# Patient Record
Sex: Female | Born: 1965 | Race: White | Hispanic: No | Marital: Married
Health system: Southern US, Community
[De-identification: ages and names within clinical notes are randomized; demographics above are authoritative.]

---

## 2002-03-01 ENCOUNTER — Other Ambulatory Visit: Admission: RE | Admit: 2002-03-01 | Discharge: 2002-03-01 | Payer: Self-pay | Admitting: Family Medicine

## 2004-02-14 ENCOUNTER — Ambulatory Visit: Payer: Self-pay | Admitting: Family Medicine

## 2004-02-20 ENCOUNTER — Ambulatory Visit: Payer: Self-pay | Admitting: Family Medicine

## 2004-02-28 ENCOUNTER — Ambulatory Visit: Payer: Self-pay | Admitting: Family Medicine

## 2004-03-20 ENCOUNTER — Encounter: Admission: RE | Admit: 2004-03-20 | Discharge: 2004-03-20 | Payer: Self-pay | Admitting: Specialist

## 2004-04-23 ENCOUNTER — Encounter: Admission: RE | Admit: 2004-04-23 | Discharge: 2004-04-23 | Payer: Self-pay | Admitting: Specialist

## 2004-05-17 ENCOUNTER — Observation Stay (HOSPITAL_COMMUNITY): Admission: RE | Admit: 2004-05-17 | Discharge: 2004-05-18 | Payer: Self-pay | Admitting: Specialist

## 2004-08-02 ENCOUNTER — Ambulatory Visit: Payer: Self-pay | Admitting: Family Medicine

## 2005-05-05 IMAGING — CT CT L SPINE W/ CM
3 of 10 series · 13 of 33 positions shown, 16 images · IV contrast (omnipaque)
Comparison: none

CLINICAL DATA: Low back and right joint pain.

LUMBAR MYELOGRAM:
CT LUMBAR SPINE WITH INTRATHECAL CONTRAST:
TECHNIQUE: An appropriate entry site was determined under fluoroscopy. Skin site
was marked, prepped with Betadine, and draped in usual sterile fashion, and
infiltrated locally with 1% lidocaine. A 22 gauge spinal needle was  advanced
into the thecal sac at L4-L5 from a midline approach. . Clear colorless CSF
returned. 17 ml Omnipaque 180 were administered intrathecally for lumbar
myelography, followed by axial CT scanning of the lumbar spine. Coronal and
sagittal reconstructions were generated from the axial scan.

[Series 4: recon 3: l spine · axial · 0.27mm/px · z∈[-205,-65]mm · 5 of 351 slices shown, 7 images]
[im 59/351  soft-tissue]
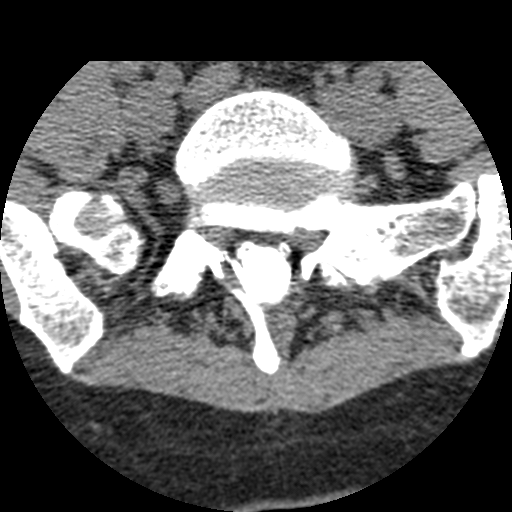
[im 59/351  bone]
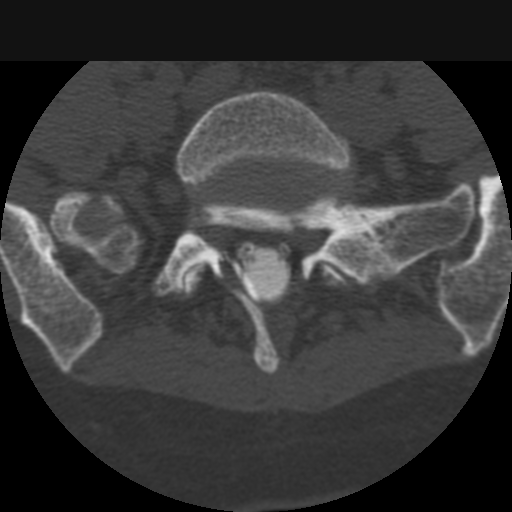
[im 117/351  bone]
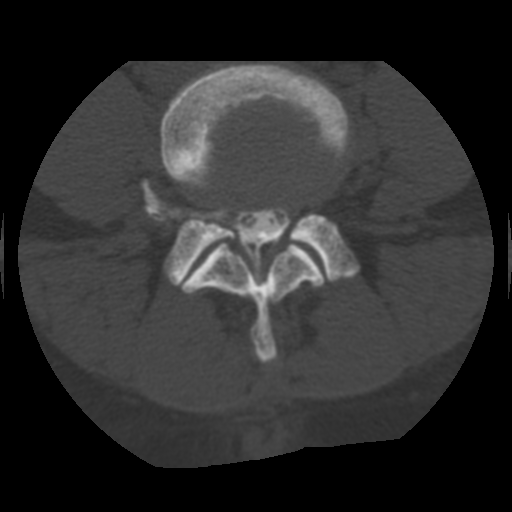
[im 176/351  bone]
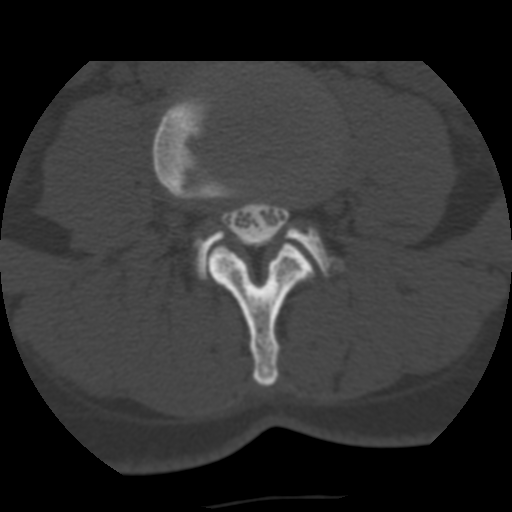
[im 234/351  bone]
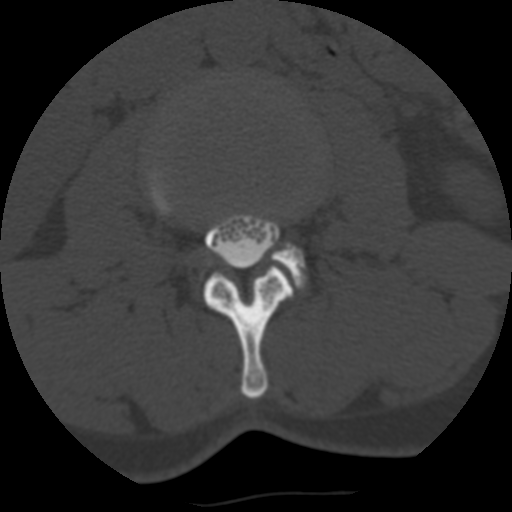
[im 292/351  soft-tissue]
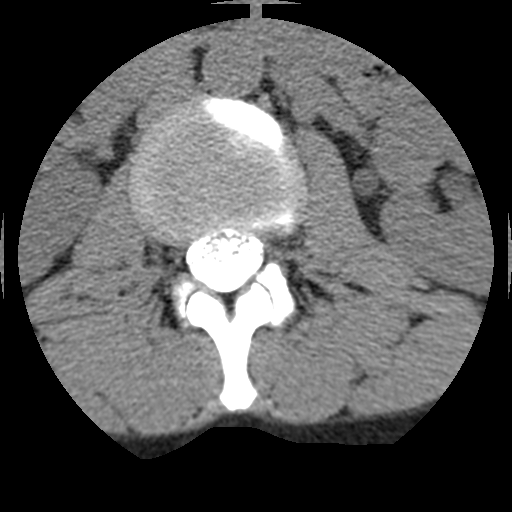
[im 292/351  bone]
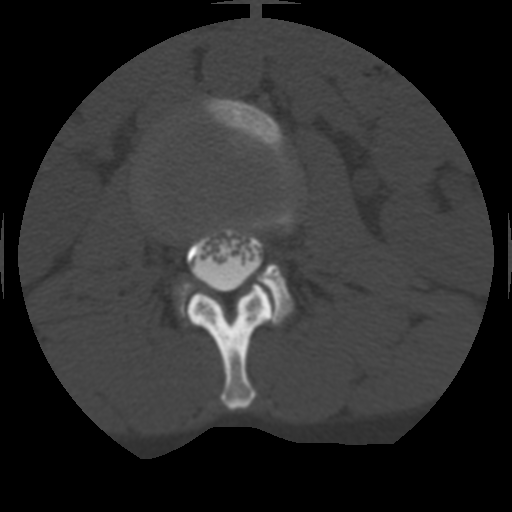

[Series 400: reformatted · sagittal · 0.42mm/px · 5 of 40 slices shown, 6 images (1 of 2)]
[im 14/40  bone]
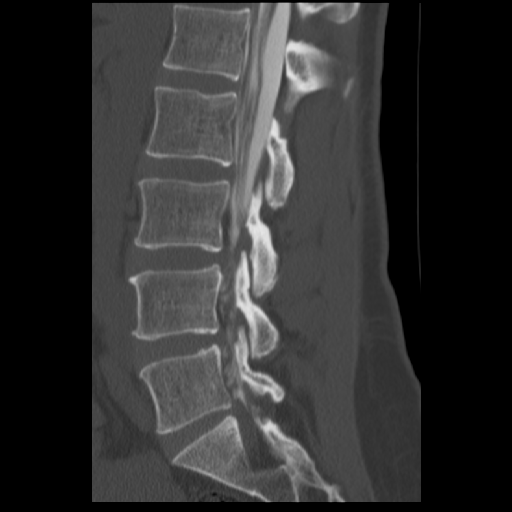
[im 17/40  bone]
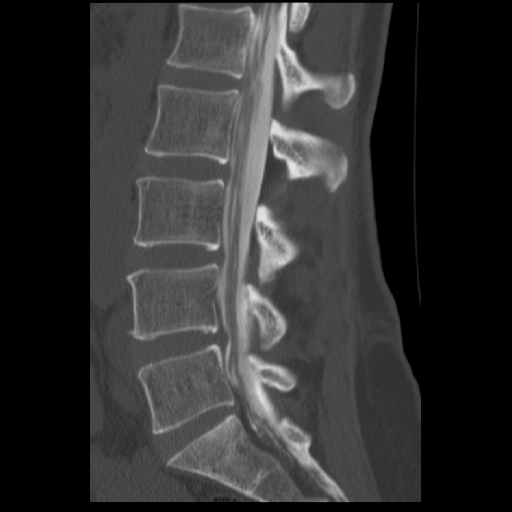
[im 20/40  soft-tissue]
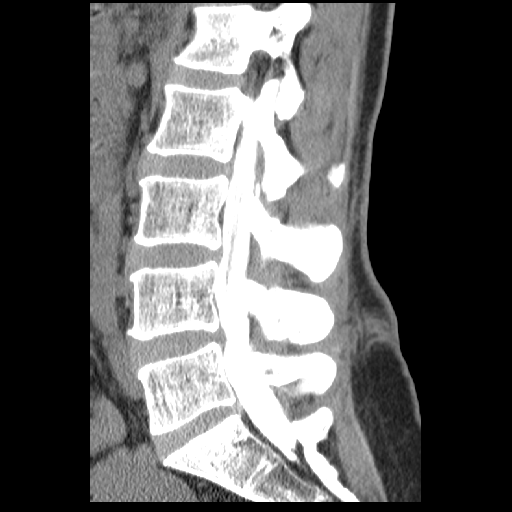
[im 20/40  bone]
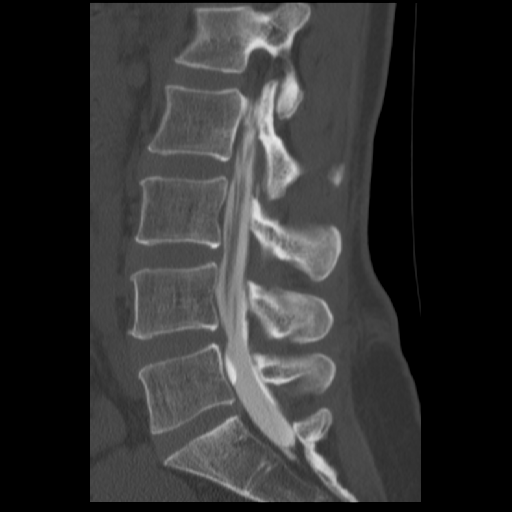
[im 23/40  bone]
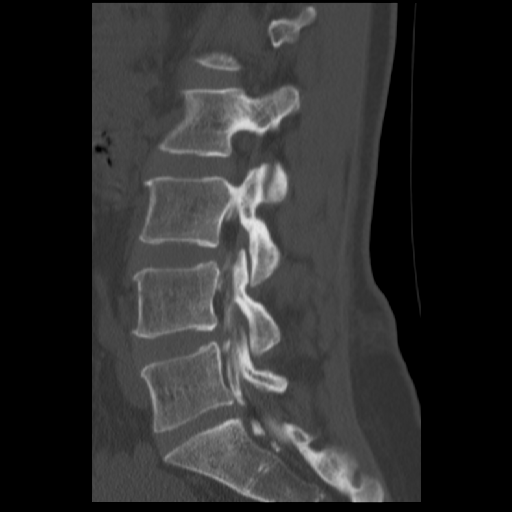
[im 27/40  bone]
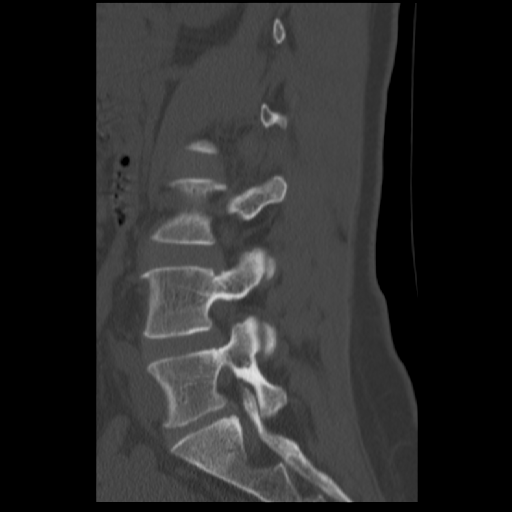

[Series 401: reformatted · coronal · 0.42mm/px · 3 of 40 slices shown (2 of 2)]
[im 8/40  bone]
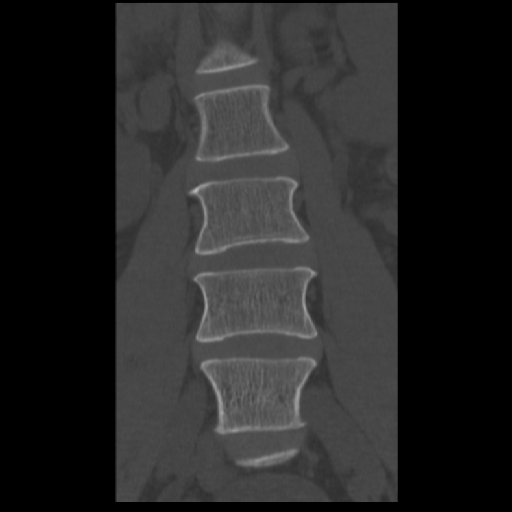
[im 16/40  bone]
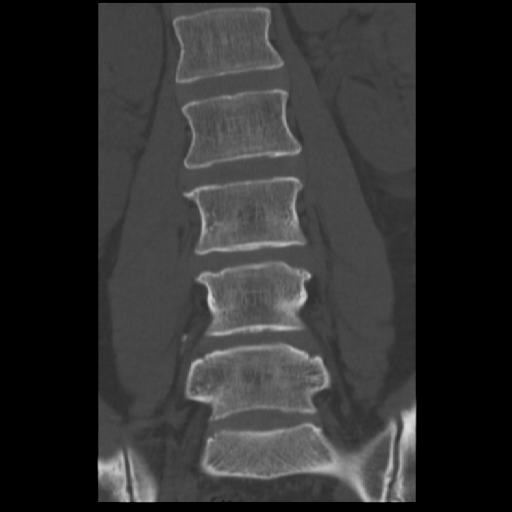
[im 24/40  bone]
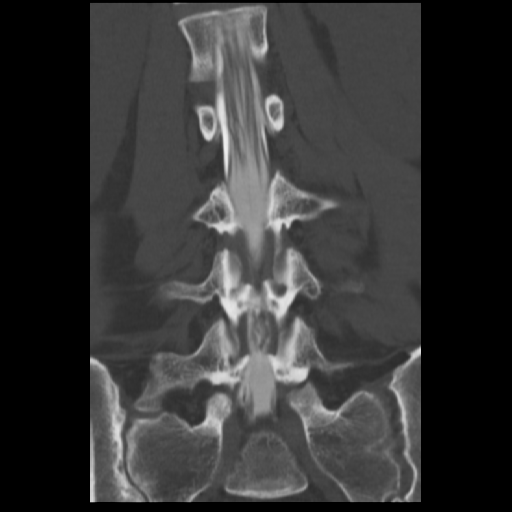

[13 of 33 positions shown; findings below may reference images not displayed]

FINDINGS: Hypoplastic ribs at T12 with a transitional L5 segment.

T12-L1: Unremarkable. Conus terminates behind L-1 vertebral body.

L1-L2: Unremarkable

L2-L3: Mild circumferential disc bulge. No spinal stenosis or significant neural
foraminal encroachment.

L3-L4: Mild circumferential disc bulge. Mild bilateral facet degenerative
hypertrophy contributing to mild multifactorial spinal stenosis. There is early
bilateral neural foraminal encroachment. No nerve root sleeve cut off
myelographically.

L4-L5: Broad posterior disc protrusion. Mild bilateral facet degenerative
hypertrophy contributing to mild multifactorial spinal stenosis. There is early
neural foraminal encroachment bilaterally. Some subarachnoid contrast material
is noted this level.

L5-S1: There is a shallow left posterolateral and foraminal disc protrusion
which may contact the left L5 nerve root in the foramen. There is no cut off
however. Neural foramina widely patent. Mild bilateral facet degenerative
change. The right L5 transverse process articulates with the sacrum.

Standing lateral flexion and extension radiographs as well as bilateral bending
films demonstrate no dynamic instability. There is early sclerosis around both
sacroiliac joints with small marginal spurs.
IMPRESSION: 1. Mild multifactorial spinal stenosis L3-L4 and L4-L5 with early neural
foraminal encroachment.
2. Shallow   left posterolateral and foraminal protrusion L5-S1.
3. Mild bilateral sacroiliitis

## 2006-05-27 ENCOUNTER — Encounter: Admission: RE | Admit: 2006-05-27 | Discharge: 2006-05-27 | Payer: Self-pay | Admitting: Specialist

## 2006-08-01 ENCOUNTER — Encounter: Admission: RE | Admit: 2006-08-01 | Discharge: 2006-08-01 | Payer: Self-pay | Admitting: Specialist

## 2006-09-11 ENCOUNTER — Encounter: Admission: RE | Admit: 2006-09-11 | Discharge: 2006-09-11 | Payer: Self-pay | Admitting: Specialist

## 2007-10-07 ENCOUNTER — Ambulatory Visit (HOSPITAL_BASED_OUTPATIENT_CLINIC_OR_DEPARTMENT_OTHER): Admission: RE | Admit: 2007-10-07 | Discharge: 2007-10-07 | Payer: Self-pay | Admitting: Orthopedic Surgery

## 2010-03-04 ENCOUNTER — Encounter: Payer: Self-pay | Admitting: Specialist

## 2010-06-26 NOTE — Op Note (Signed)
NAMENURY, NEBERGALL          ACCOUNT NO.:  [id number]   MEDICAL RECORD NO.:  [id number]          PATIENT TYPE:  AMB   LOCATION:  DSC                          FACILITY:  [facility]   PHYSICIAN:  Leonides Grills, M.D.     DATE OF BIRTH:  1965/09/30   DATE OF PROCEDURE:  10/07/2007  DATE OF DISCHARGE:                               OPERATIVE REPORT   PREOPERATIVE DIAGNOSIS:  Left anterior ankle impingement.   POSTOPERATIVE DIAGNOSIS:  Left anterior ankle impingement.   OPERATION:  Left ankle arthroscopy with extensive debridement.   ANESTHESIA:  General.   SURGEON:  Leonides Grills, MD   ASSISTANT:  Richardean Canal, PA-C.   ESTIMATED BLOOD LOSS:  Minimal.   TOURNIQUET TIME:  None.   COMPLICATIONS:  None.   DISPOSITION:  Stable to PR.   INDICATIONS:  This is a 45 year old female who was injured at work and  has had persistent left anterior ankle pain that was interfering with  her life.  She was consented for the above procedure.  All risks  including infection, vessel injury, persistent pain, worsening pain,  prolonged recovery, stiffness, arthritis, sinus formation, and synovial  cyst formation were all explained.  Questions were encouraged and  answered.   OPERATION:  The patient was brought to the operating room and placed in  supine position after adequate general anesthesia was administered as  well as Ancef 1 g IV piggyback.  A bump was placed in the left  ipsilateral hip, internally rotating the left lower extremity.  The left  lower extremity was prepped and draped in sterile manner.  No tourniquet  was used.  Anatomical landmarks including anterior tibialis tendon and  peroneus tertius tendon were then mapped out.  Superficial peroneal  nerve could not be seen.  Spinal needle was then placed just medial to  the anterior tibialis tendon in the ankle and 20 mL of normal saline was  instilled in the ankle.  Nick-and-spread technique was then utilized to  create the  anteromedial portal.  Blunt tip trocar with cannula followed  by camera was then placed in the ankle.  Under direct visualization, the  anterolateral portal was created lateral to the peroneus tertius tendon.  Using a nick-and-spread technique from illuminating from inside out,  there was no evidence of superficial peroneal nerve in this interval.  There was a concentration of a large amount of synovitis on the  anterolateral aspect of the ankle over and around the accessory tib-fib  ligament.  The anterolateral corner of the talar dome was impinging this  area as well when you dorsiflex the ankle.  This was extensively  debrided with a shaver as well as a radiofrequency bevel.  This extended  into the anterolateral gutter as well, and this was also debrided.  Care  was taken not to violate the anterior talofibular ligament.  We then  placed a camera anterolaterally visualizing anteromedially.  Again,  there was a large amount of synovitis anteromedially that extend into  lateral gutter and appeared that she had a previous medial malleolar  fracture that had a small osteochondral lesion in this area as  well.  The synovitis was extensively debrided with a shaver as well as  radiofrequency bevel, and then we debrided the osteochondral lesion in  the medial shoulder of the plafond with a House curette.  This was very  similar to the position of a syndesmosis.  It was very small in nature  and it was concentrated anteriorly.  Once this was debrided, a shaver  was used to round this out as well.  It was about 2 mm in diameter.  Pictures were obtained throughout the procedure.  Camera was removed.  The wounds were closed with 4-0 nylon stitch.  Sterile dressing was  applied.  Cam walker boot was applied.  The patient was stable to PR.      Leonides Grills, M.D.  Electronically Signed     PB/MEDQ  D:  10/07/2007  T:  10/08/2007  Job:  [id number]

## 2010-06-29 NOTE — Op Note (Signed)
Kelly Brock, Kelly Brock          ACCOUNT NO.:  [id number]   MEDICAL RECORD NO.:  [id number]          PATIENT TYPE:  AMB   LOCATION:  DAY                          FACILITY:  [facility]   PHYSICIAN:  Jene Every, M.D.    DATE OF BIRTH:  20-Nov-1965   DATE OF PROCEDURE:  05/17/2004  DATE OF DISCHARGE:                                 OPERATIVE REPORT   PREOPERATIVE DIAGNOSES:  Spinal stenosis, herniated nucleus pulposus at 4-5  on the right.   POSTOPERATIVE DIAGNOSES:  Spinal stenosis, herniated nucleus pulposus at 4-5  on the right.   PROCEDURE:  Lateral recessed decompression and microdiskectomy L4-5,  foraminotomy L5.   ANESTHESIA:  General.   ASSISTANT:  Roma Schanz, P.A.   BRIEF HISTORY:  This is a 45 year old with right lower extremity  radiculopathy, L5 nerve root distribution, EHL weakness, positive neural  impingement signs, MRI and myelogram indicating lateral recess stenosis,  facet hypertrophy and a disk protrusion. __operative indication was  to________ decompress the L5 nerve root. Operative intervention was  indicated for decompression of the L5 nerve root, pain relief and to allow  the nerve to recovery. The risks and benefits were discussed including  bleeding, infection, damage to neurovascular structures, CSF leakage,  epidural fibrosis, need for fusion in the future and anesthetic  complications.   TECHNIQUE:  The patient in the supine position and after the induction of  adequate general anesthesia and 1 g of Kefzol, she was placed prone on the  [place] frame, all bony prominences well padded. The lumbar region was  prepped and draped in the usual sterile fashion. An 18 gauge spinal needle  was utilize to localize the 4-5 interspace confirmed with x-ray. An incision  was made from the spinous process of the 4-5, subcutaneous tissue was  dissected, electrocautery was utilized to achieve hemostasis. The  dorsolumbar fascia identified and divided in line  with the skin incision.  Paraspinous muscle elevated from the lamina of 4 and 5, McCullough retractor  was placed. The Penfield 4 placed in the intralaminar space confirmed with x-  ray at 4-5. The operating microscope was draped and brought into the  surgical field. Noted was an increased lumbosacral angle and facet  hypertrophy at this level. A hemilaminotomy over the caudad edge of 3 was  performed with a 2 and a 3 mm Kerrison, ligamentum flavum attached from the  cephalad edge of 5 with a 2 mm Kerrison extremely tight in the lateral  recess. Foraminotomy of L5 was performed to decompress the lateral recess to  the medial border of the pedicle undercutting the facet with a spur noted on  the superior articular process of 5. After removal of the ligamentum flavum  and decompression of the lateral recess and foraminotomy, I gently mobilized  the thecal sac and the L5 nerve root medially. Focal HNP was noted. The  annulotomy was performed.  A copious portion of disk material was removed  from the disk space. This was performed in a piecemeal fashion with a  straight and upbiting pituitary as well as mobilization with a hockey stick  and a neural hook.  After diskectomy of all herniated material, I checked  beneath the thecal sac, axilla, foramen of 4 and 5 and they are found to be  widely patent without residual disk herniation compressing the 5 root. The  thecal sac and root were pulsatile with no evidence of CSF leakage. There  was at least a centimeter __________ medial to the pedicle without tension  on the root. The disk space was copiously irrigated with antibiotic  irrigation. Thrombin soaked Gelfoam was placed in the laminotomy defect,  bone wax on the cancellous surfaces. The McCullough retractor was removed,  paraspinous muscles inspected with no evidence of active bleeding. The  dorsolumbar fascia was reapproximated with #1 Vicryl interrupted figure-of-  eight sutures,  subcutaneous tissue reapproximated with 2-0 Vicryl simple  sutures. The skin was reapproximated with 4-0 subcuticular Prolene. The  wound reinforced with Steri-Strips, sterile dressing applied, placed supine  on the hospital bed, extubated without difficulty and transported to the  recovery room in satisfactory condition.   The patient tolerated the procedure well with no complications.  Assistant  Roma Schanz, P.A., minimal blood loss.      JB/MEDQ  D:  05/17/2004  T:  05/17/2004  Job:  [id number]

## 2015-08-08 DIAGNOSIS — M545 Low back pain, unspecified: Secondary | ICD-10-CM | POA: Insufficient documentation

## 2015-08-23 DIAGNOSIS — Z23 Encounter for immunization: Secondary | ICD-10-CM | POA: Insufficient documentation

## 2015-08-23 DIAGNOSIS — Z0000 Encounter for general adult medical examination without abnormal findings: Secondary | ICD-10-CM | POA: Insufficient documentation

## 2015-10-18 DIAGNOSIS — M5416 Radiculopathy, lumbar region: Secondary | ICD-10-CM | POA: Insufficient documentation

## 2020-06-26 ENCOUNTER — Ambulatory Visit: Payer: BC Managed Care – PPO | Admitting: Podiatry

## 2020-06-26 ENCOUNTER — Other Ambulatory Visit: Payer: Self-pay

## 2020-06-26 DIAGNOSIS — M79671 Pain in right foot: Secondary | ICD-10-CM | POA: Diagnosis not present

## 2020-06-26 DIAGNOSIS — M7741 Metatarsalgia, right foot: Secondary | ICD-10-CM | POA: Diagnosis not present

## 2020-06-26 DIAGNOSIS — M79672 Pain in left foot: Secondary | ICD-10-CM | POA: Diagnosis not present

## 2020-06-26 DIAGNOSIS — M7742 Metatarsalgia, left foot: Secondary | ICD-10-CM

## 2020-06-26 DIAGNOSIS — G588 Other specified mononeuropathies: Secondary | ICD-10-CM | POA: Diagnosis not present

## 2020-06-26 MED ORDER — BETAMETHASONE SOD PHOS & ACET 6 (3-3) MG/ML IJ SUSP
6.0000 mg | Freq: Once | INTRAMUSCULAR | Status: AC
  Administered 2020-06-26: 6 mg

## 2020-06-26 NOTE — Progress Notes (Signed)
  Subjective:  Patient ID: Kelly Brock, female    DOB: 08/28/1965,  MRN: [id number]  Chief Complaint  Patient presents with  . Foot Problem    Pain and swelling at interspace BL 2nd-3rd toes and bunirn ag BL medial archs x 2 years; 7/10 pain - with itching at 5th toes (B/W) -pcp has Rx'd prednisone and lasix with no help     55 y.o. female presents with the above complaint. History confirmed with patient.   Objective:  Physical Exam: warm, good capillary refill, no trophic changes or ulcerative lesions, normal DP and PT pulses and normal sensory exam. Left Foot: POP 2nd interspace . Local edema. No mulder's click. No POP medial calc tuber, other metatarsal heads or interspaces. Right Foot: POP 2nd interspace. No mulder's click. No POP medial calc tuber, other metatarsal heads or interspaces.  No images are attached to the encounter.  Radiographs: X-ray of both feet: no fracture, dislocation, swelling or degenerative changes noted Assessment:   1. Interdigital neuroma   2. Bilateral foot pain   3. Metatarsalgia of both feet    Plan:  Patient was evaluated and treated and all questions answered.  Interdigital Neuroma -Educated on etiology -XR reviewed with patient -Injection delivered to the affected interspaces  Procedure: Neuroma Injection Location: Bilateral 2nd interspace Skin Prep: Alcohol. Injectate: 0.5 cc 0.5% marcaine plain, 0.5 cc betamethasone acetate-betamethasone sodium phosphate Disposition: Patient tolerated procedure well. Injection site dressed with a band-aid.  Return in about 3 weeks (around 07/17/2020) for Neuroma.

## 2020-07-20 ENCOUNTER — Encounter: Payer: Self-pay | Admitting: Podiatry

## 2020-07-20 ENCOUNTER — Ambulatory Visit: Payer: BC Managed Care – PPO | Admitting: Podiatry

## 2020-07-20 ENCOUNTER — Other Ambulatory Visit: Payer: Self-pay

## 2020-07-20 DIAGNOSIS — M75111 Incomplete rotator cuff tear or rupture of right shoulder, not specified as traumatic: Secondary | ICD-10-CM | POA: Insufficient documentation

## 2020-07-20 DIAGNOSIS — R011 Cardiac murmur, unspecified: Secondary | ICD-10-CM | POA: Insufficient documentation

## 2020-07-20 DIAGNOSIS — F419 Anxiety disorder, unspecified: Secondary | ICD-10-CM | POA: Insufficient documentation

## 2020-07-20 DIAGNOSIS — Z780 Asymptomatic menopausal state: Secondary | ICD-10-CM | POA: Insufficient documentation

## 2020-07-20 DIAGNOSIS — K5900 Constipation, unspecified: Secondary | ICD-10-CM | POA: Insufficient documentation

## 2020-07-20 DIAGNOSIS — Z79899 Other long term (current) drug therapy: Secondary | ICD-10-CM | POA: Insufficient documentation

## 2020-07-20 DIAGNOSIS — E559 Vitamin D deficiency, unspecified: Secondary | ICD-10-CM | POA: Insufficient documentation

## 2020-07-20 DIAGNOSIS — R609 Edema, unspecified: Secondary | ICD-10-CM | POA: Insufficient documentation

## 2020-07-20 DIAGNOSIS — S90426A Blister (nonthermal), unspecified lesser toe(s), initial encounter: Secondary | ICD-10-CM | POA: Insufficient documentation

## 2020-07-20 DIAGNOSIS — R5383 Other fatigue: Secondary | ICD-10-CM | POA: Insufficient documentation

## 2020-07-20 DIAGNOSIS — Z87898 Personal history of other specified conditions: Secondary | ICD-10-CM | POA: Insufficient documentation

## 2020-07-20 DIAGNOSIS — G588 Other specified mononeuropathies: Secondary | ICD-10-CM

## 2020-07-20 DIAGNOSIS — E119 Type 2 diabetes mellitus without complications: Secondary | ICD-10-CM | POA: Insufficient documentation

## 2020-07-20 DIAGNOSIS — E039 Hypothyroidism, unspecified: Secondary | ICD-10-CM | POA: Insufficient documentation

## 2020-07-20 DIAGNOSIS — J302 Other seasonal allergic rhinitis: Secondary | ICD-10-CM | POA: Insufficient documentation

## 2020-07-20 DIAGNOSIS — E782 Mixed hyperlipidemia: Secondary | ICD-10-CM | POA: Insufficient documentation

## 2020-07-20 DIAGNOSIS — G4700 Insomnia, unspecified: Secondary | ICD-10-CM | POA: Insufficient documentation

## 2020-07-20 DIAGNOSIS — M25519 Pain in unspecified shoulder: Secondary | ICD-10-CM | POA: Insufficient documentation

## 2020-07-20 DIAGNOSIS — U071 COVID-19: Secondary | ICD-10-CM | POA: Insufficient documentation

## 2020-07-20 DIAGNOSIS — Z720 Tobacco use: Secondary | ICD-10-CM | POA: Insufficient documentation

## 2020-07-20 DIAGNOSIS — I10 Essential (primary) hypertension: Secondary | ICD-10-CM | POA: Insufficient documentation

## 2020-07-20 DIAGNOSIS — G894 Chronic pain syndrome: Secondary | ICD-10-CM | POA: Insufficient documentation

## 2020-07-20 DIAGNOSIS — M79609 Pain in unspecified limb: Secondary | ICD-10-CM | POA: Insufficient documentation

## 2020-07-20 DIAGNOSIS — D518 Other vitamin B12 deficiency anemias: Secondary | ICD-10-CM | POA: Insufficient documentation

## 2020-07-20 MED ORDER — BETAMETHASONE SOD PHOS & ACET 6 (3-3) MG/ML IJ SUSP
6.0000 mg | Freq: Once | INTRAMUSCULAR | Status: AC
  Administered 2020-07-20: 6 mg

## 2020-07-20 NOTE — Progress Notes (Signed)
  Subjective:  Patient ID: Kelly Brock, female    DOB: 02/21/1965,  MRN: [id number]  Chief Complaint  Patient presents with   Neuroma    The injections did good and then the swelling came back    55 y.o. female presents with the above complaint. History confirmed with patient.   Objective:  Physical Exam: warm, good capillary refill, no trophic changes or ulcerative lesions, normal DP and PT pulses and normal sensory exam. Left Foot: POP 2nd interspace . Local edema. No mulder's click. No POP medial calc tuber, other metatarsal heads or interspaces. Right Foot: POP 2nd interspace. No mulder's click. No POP medial calc tuber, other metatarsal heads or interspaces.   Assessment:   1. Interdigital neuroma    Plan:  Patient was evaluated and treated and all questions answered.  Interdigital Neuroma -Repeat injection as below  Procedure: Neuroma Injection Location: Bilateral 2nd interspace Skin Prep: Alcohol. Injectate: 0.5 cc 0.5% marcaine plain, 0.5 cc betamethasone acetate-betamethasone sodium phosphate Disposition: Patient tolerated procedure well. Injection site dressed with a band-aid.   No follow-ups on file.

## 2020-08-28 ENCOUNTER — Other Ambulatory Visit: Payer: Self-pay

## 2020-08-28 ENCOUNTER — Ambulatory Visit: Payer: BC Managed Care – PPO | Admitting: Podiatry

## 2020-08-28 DIAGNOSIS — G588 Other specified mononeuropathies: Secondary | ICD-10-CM | POA: Diagnosis not present

## 2020-09-04 MED ORDER — DEXAMETHASONE SODIUM PHOSPHATE 120 MG/30ML IJ SOLN
4.0000 mg | Freq: Once | INTRAMUSCULAR | Status: AC
  Administered 2020-09-04: 4 mg via INTRAMUSCULAR

## 2020-09-04 NOTE — Progress Notes (Signed)
  Subjective:  Patient ID: Kelly Brock, female    DOB: [birth year],  MRN: [id number]  Chief Complaint  Patient presents with   Neuroma    F/U BL neuroma -pt states," it helped with shot, I can tell a difference," - w/ swelling at LT Tx: icing and elvation    55 y.o. female presents with the above complaint. History confirmed with patient.   Objective:  Physical Exam: warm, good capillary refill, no trophic changes or ulcerative lesions, normal DP and PT pulses and normal sensory exam. Left Foot: POP 2nd interspace . Local edema. No mulder's click. No POP medial calc tuber, other metatarsal heads or interspaces. Right Foot: POP 2nd interspace. No mulder's click. No POP medial calc tuber, other metatarsal heads or interspaces.   Assessment:   1. Interdigital neuroma    Plan:  Patient was evaluated and treated and all questions answered.  Interdigital Neuroma -Repeat injection as below  Procedure: Neuroma Injection Location: Bilateral 2nd interspace Skin Prep: Alcohol. Injectate: 1 cc 0.5% lidocaine plain, 1 cc dexamethasone  Disposition: Patient tolerated procedure well. Injection site dressed with a band-aid.   Return if symptoms worsen or fail to improve.
